# Patient Record
Sex: Male | Born: 1955 | Hispanic: No | Marital: Single
Health system: Southern US, Community
[De-identification: ages and names within clinical notes are randomized; demographics above are authoritative.]

---

## 2003-09-11 ENCOUNTER — Other Ambulatory Visit: Payer: Self-pay

## 2003-12-17 ENCOUNTER — Other Ambulatory Visit: Payer: Self-pay

## 2004-08-14 ENCOUNTER — Emergency Department: Payer: Self-pay | Admitting: Emergency Medicine

## 2004-08-14 ENCOUNTER — Ambulatory Visit: Payer: Self-pay | Admitting: Emergency Medicine

## 2004-08-17 ENCOUNTER — Ambulatory Visit: Payer: Self-pay | Admitting: Surgery

## 2006-03-06 ENCOUNTER — Inpatient Hospital Stay: Payer: Self-pay | Admitting: Internal Medicine

## 2006-03-06 ENCOUNTER — Other Ambulatory Visit: Payer: Self-pay

## 2007-05-14 ENCOUNTER — Inpatient Hospital Stay: Payer: Self-pay | Admitting: Internal Medicine

## 2007-05-14 ENCOUNTER — Other Ambulatory Visit: Payer: Self-pay

## 2008-09-05 ENCOUNTER — Inpatient Hospital Stay: Payer: Self-pay | Admitting: Specialist

## 2008-09-06 ENCOUNTER — Ambulatory Visit: Payer: Self-pay | Admitting: Cardiology

## 2010-02-03 ENCOUNTER — Emergency Department: Payer: Self-pay | Admitting: Unknown Physician Specialty

## 2011-05-06 ENCOUNTER — Observation Stay: Payer: Self-pay | Admitting: Specialist

## 2011-09-26 ENCOUNTER — Inpatient Hospital Stay: Payer: Self-pay | Admitting: Internal Medicine

## 2011-09-26 LAB — URINALYSIS, COMPLETE
Bacteria: NONE SEEN
Ketone: NEGATIVE
Leukocyte Esterase: NEGATIVE
Ph: 6 (ref 4.5–8.0)
Protein: 100
Specific Gravity: 1.026 (ref 1.003–1.030)
Squamous Epithelial: <1
WBC UR: 20 /HPF (ref 0–5)

## 2011-09-26 LAB — BASIC METABOLIC PANEL
Anion Gap: 13 (ref 7–16)
BUN: 26 mg/dL — ABNORMAL HIGH (ref 7–18)
Calcium, Total: 8.6 mg/dL (ref 8.5–10.1)
Chloride: 89 mmol/L — ABNORMAL LOW (ref 98–107)
Creatinine: 1.53 mg/dL — ABNORMAL HIGH (ref 0.60–1.30)
EGFR (African American): >60
EGFR (Non-African Amer.): 50 — ABNORMAL LOW
Glucose: 440 mg/dL — ABNORMAL HIGH (ref 65–99)
Osmolality: 288 (ref 275–301)
Potassium: 4.1 mmol/L (ref 3.5–5.1)
Sodium: 132 mmol/L — ABNORMAL LOW (ref 136–145)

## 2011-09-26 LAB — CBC WITH DIFFERENTIAL/PLATELET
Basophil #: 0.1 10*3/uL (ref 0.0–0.1)
Basophil %: 0.4 %
Eosinophil #: 0 10*3/uL (ref 0.0–0.7)
Eosinophil %: 0.1 %
Lymphocyte #: 1.8 10*3/uL (ref 1.0–3.6)
Lymphocyte %: 10 %
MCH: 31.8 pg (ref 26.0–34.0)
MCV: 95 fL (ref 80–100)
Monocyte #: 1.4 10*3/uL — ABNORMAL HIGH (ref 0.0–0.7)
Neutrophil %: 82 %
Platelet: 166 10*3/uL (ref 150–440)
RBC: 4.83 10*6/uL (ref 4.40–5.90)
RDW: 14 % (ref 11.5–14.5)
WBC: 18.2 10*3/uL — ABNORMAL HIGH (ref 3.8–10.6)

## 2011-09-27 LAB — CBC WITH DIFFERENTIAL/PLATELET
Eosinophil #: 0.2 10*3/uL (ref 0.0–0.7)
Eosinophil %: 1.5 %
Lymphocyte %: 14.1 %
MCH: 32.1 pg (ref 26.0–34.0)
MCV: 95 fL (ref 80–100)
Monocyte #: 1.1 10*3/uL — ABNORMAL HIGH (ref 0.0–0.7)
Monocyte %: 8.6 %
Neutrophil #: 9.5 10*3/uL — ABNORMAL HIGH (ref 1.4–6.5)
Neutrophil %: 75.7 %
Platelet: 210 10*3/uL (ref 150–440)
RBC: 4.56 10*6/uL (ref 4.40–5.90)
RDW: 14.1 % (ref 11.5–14.5)
WBC: 12.6 10*3/uL — ABNORMAL HIGH (ref 3.8–10.6)

## 2011-09-27 LAB — BASIC METABOLIC PANEL
Calcium, Total: 8.2 mg/dL — ABNORMAL LOW (ref 8.5–10.1)
Chloride: 97 mmol/L — ABNORMAL LOW (ref 98–107)
Creatinine: 1.05 mg/dL (ref 0.60–1.30)
EGFR (Non-African Amer.): >60
Glucose: 109 mg/dL — ABNORMAL HIGH (ref 65–99)
Osmolality: 276 (ref 275–301)
Potassium: 3.8 mmol/L (ref 3.5–5.1)
Sodium: 136 mmol/L (ref 136–145)

## 2011-09-27 LAB — URINE CULTURE

## 2011-09-28 LAB — PRO B NATRIURETIC PEPTIDE: B-Type Natriuretic Peptide: 1009 pg/mL — ABNORMAL HIGH (ref 0–125)

## 2011-09-28 LAB — WBC: WBC: 9.7 10*3/uL (ref 3.8–10.6)

## 2011-10-02 LAB — CULTURE, BLOOD (SINGLE)

## 2012-06-06 ENCOUNTER — Emergency Department: Payer: Self-pay | Admitting: Emergency Medicine

## 2012-06-06 LAB — RAPID INFLUENZA A&B ANTIGENS

## 2012-06-09 ENCOUNTER — Emergency Department: Payer: Self-pay | Admitting: Emergency Medicine

## 2012-06-09 LAB — COMPREHENSIVE METABOLIC PANEL
Albumin: 1.9 g/dL — ABNORMAL LOW (ref 3.4–5.0)
Alkaline Phosphatase: 337 U/L — ABNORMAL HIGH (ref 50–136)
Anion Gap: 14 (ref 7–16)
BUN: 65 mg/dL — ABNORMAL HIGH (ref 7–18)
Bilirubin,Total: 0.6 mg/dL (ref 0.2–1.0)
Chloride: 96 mmol/L — ABNORMAL LOW (ref 98–107)
Creatinine: 3.35 mg/dL — ABNORMAL HIGH (ref 0.60–1.30)
EGFR (African American): 23 — ABNORMAL LOW
Osmolality: 293 (ref 275–301)
Potassium: 3.6 mmol/L (ref 3.5–5.1)
SGOT(AST): 28 U/L (ref 15–37)
Sodium: 133 mmol/L — ABNORMAL LOW (ref 136–145)
Total Protein: 6.6 g/dL (ref 6.4–8.2)

## 2012-06-09 LAB — CBC WITH DIFFERENTIAL/PLATELET
Basophil %: 0.4 %
Eosinophil #: 0 10*3/uL (ref 0.0–0.7)
HGB: 17.6 g/dL (ref 13.0–18.0)
Lymphocyte #: 0.3 10*3/uL — ABNORMAL LOW (ref 1.0–3.6)
Lymphocyte %: 1 %
MCHC: 32.8 g/dL (ref 32.0–36.0)
Monocyte %: 2.3 %
Neutrophil #: 30.5 10*3/uL — ABNORMAL HIGH (ref 1.4–6.5)
RDW: 15 % — ABNORMAL HIGH (ref 11.5–14.5)
WBC: 31.7 10*3/uL — ABNORMAL HIGH (ref 3.8–10.6)

## 2012-06-09 LAB — CK TOTAL AND CKMB (NOT AT ARMC)
CK, Total: 234 U/L — ABNORMAL HIGH (ref 35–232)
CK-MB: 9.8 ng/mL — ABNORMAL HIGH (ref 0.5–3.6)

## 2012-11-16 DEATH — deceased

## 2014-08-22 IMAGING — CT CT ABD-PELV W/O CM
1 of 3 series · 14 of 32 positions shown, 18 images · non-contrast
Comparison: none

REASON FOR EXAM: (1) pain swelling, hi wbc count poss forniers gangrene
creat hi; (2) pain, swelling
COMMENTS:

PROCEDURE:     CT  - CT ABDOMEN AND PELVIS W[DATE] [DATE]
RESULT:     This study was compared to a previous study dated 09/26/2011.
TECHNIQUE: Helical noncontrasted 3 mm sections were obtained from the lung
bases through the pubic symphysis.

[Series 3: 3mm soft tissue · axial · 0.79mm/px · z∈[-1262,-802]mm · 14 of 171 slices shown, 18 images]
[im 9/171  soft-tissue]
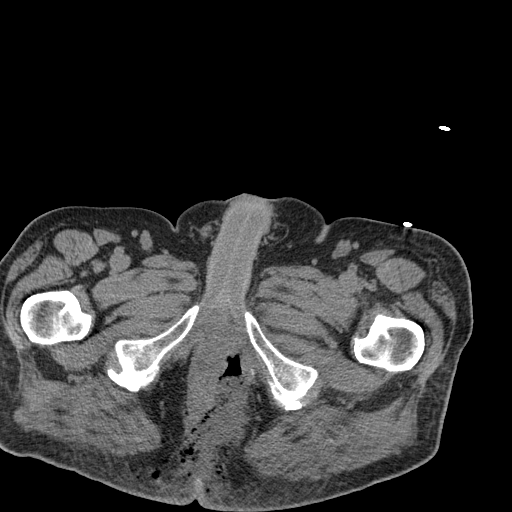
[im 9/171  bone]
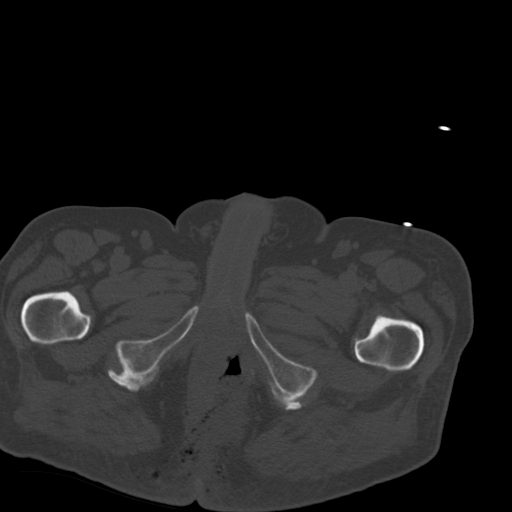
[im 25/171  soft-tissue]
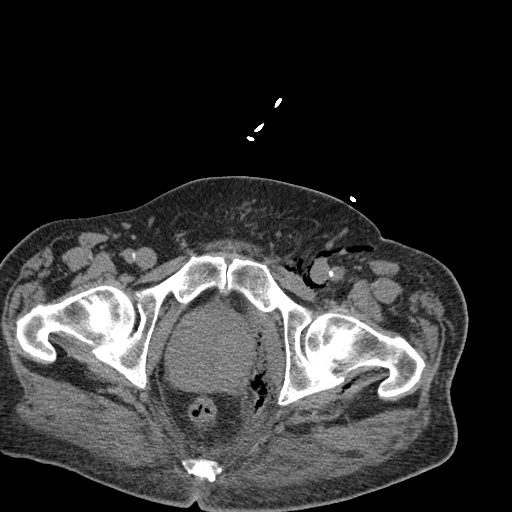
[im 41/171  soft-tissue]
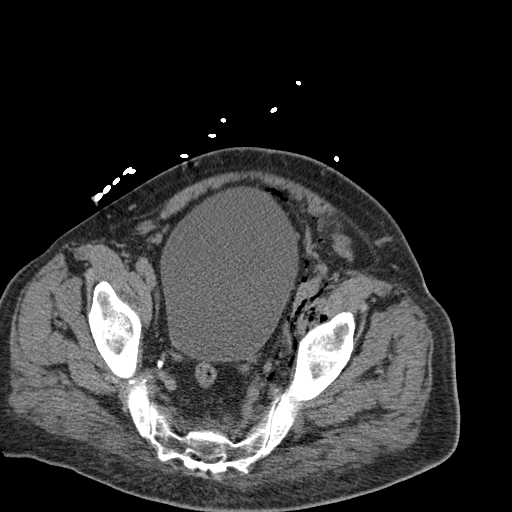
[im 49/171  soft-tissue]
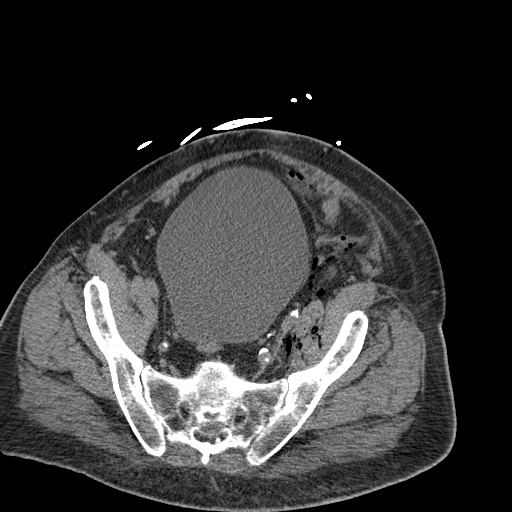
[im 65/171  soft-tissue]
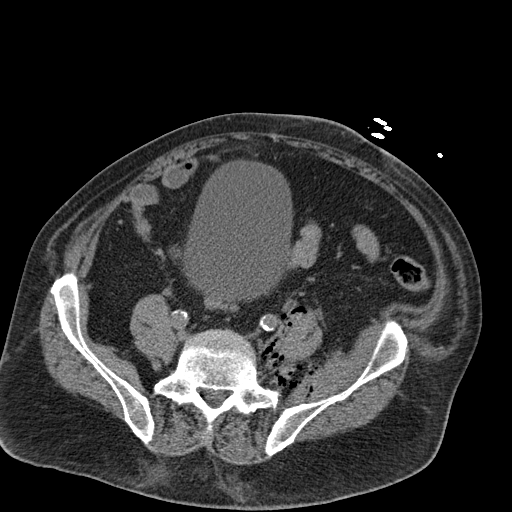
[im 81/171  soft-tissue]
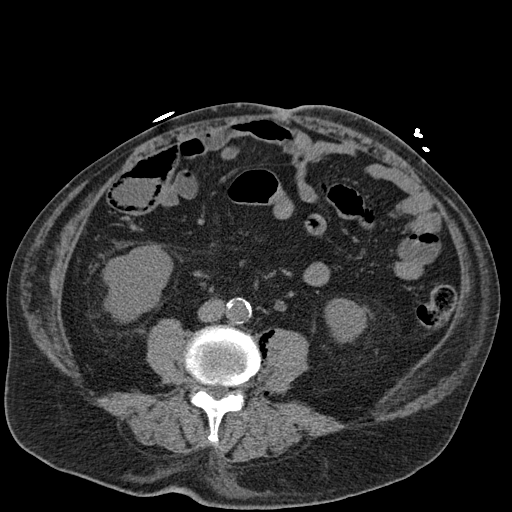
[im 90/171  soft-tissue]
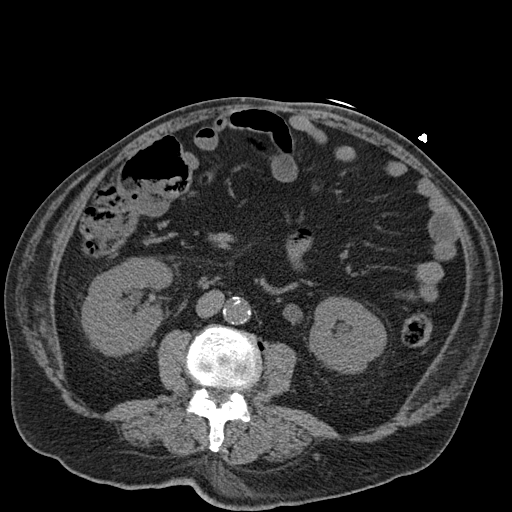
[im 106/171  soft-tissue]
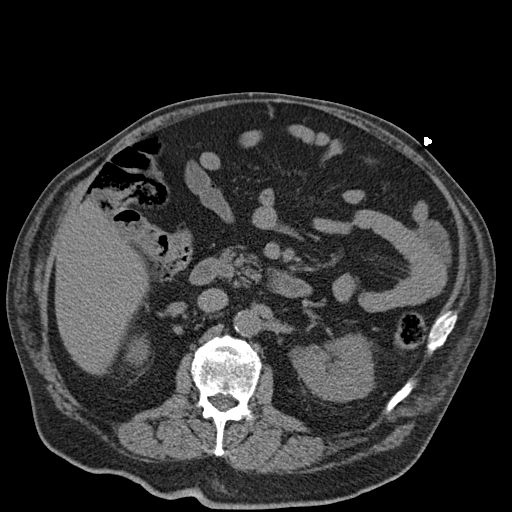
[im 122/171  soft-tissue]
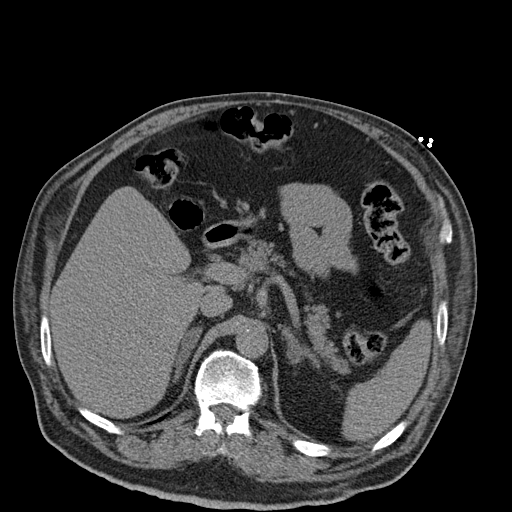
[im 122/171  bone]
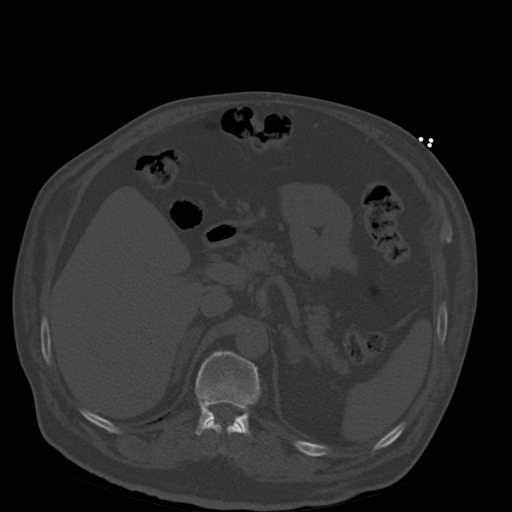
[im 130/171  soft-tissue]
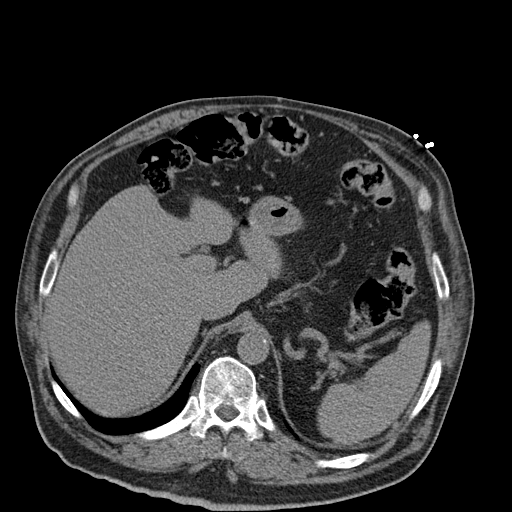
[im 138/171  lung]
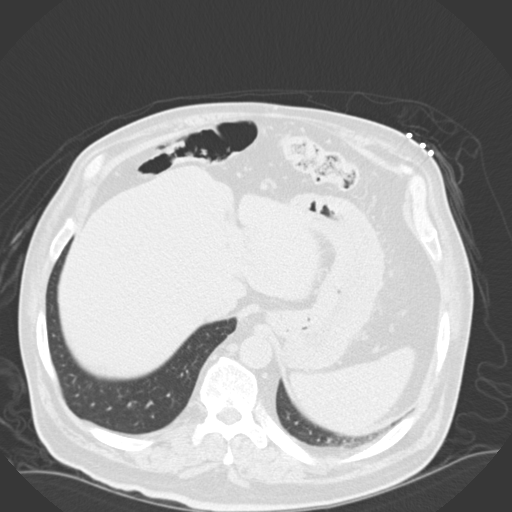
[im 146/171  soft-tissue]
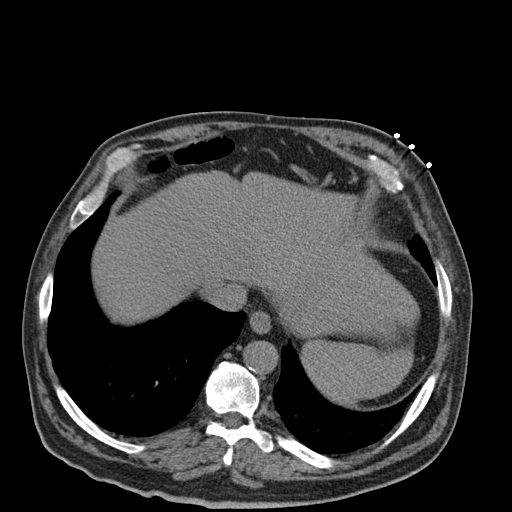
[im 146/171  lung]
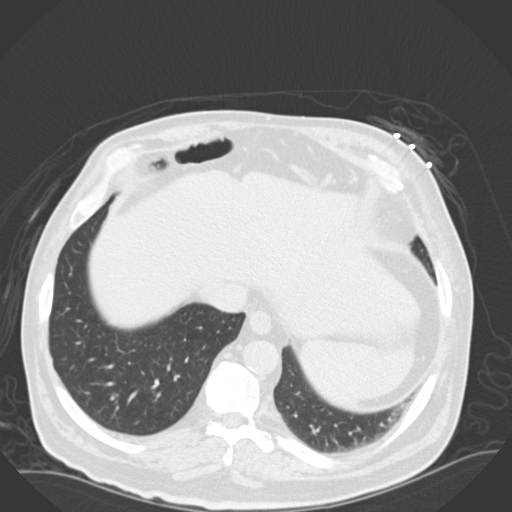
[im 154/171  lung]
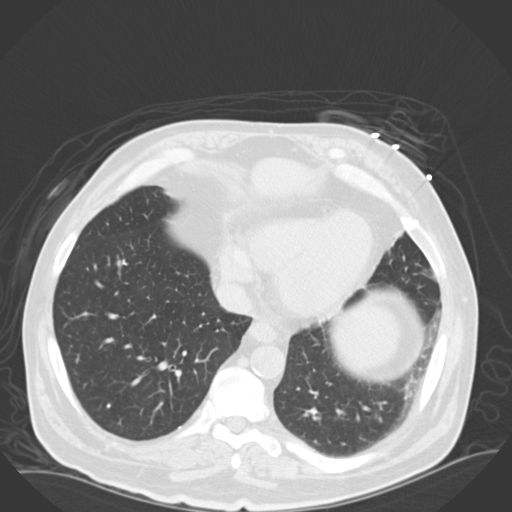
[im 162/171  soft-tissue]
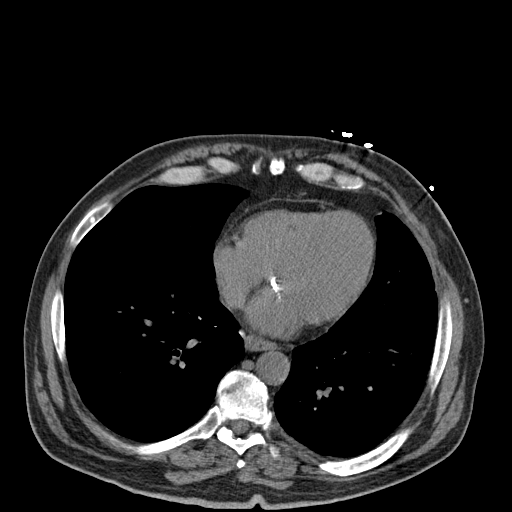
[im 162/171  lung]
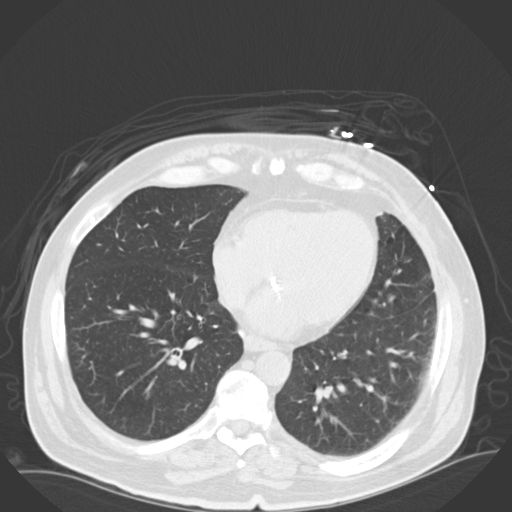

[14 of 32 positions shown; findings below may reference images not displayed]

FINDINGS: The lung bases demonstrate stable pulmonary nodules within the
left lung base as well as hypoventilation versus possibly mild infiltrate.

Noncontrast evaluation of the liver, spleen, adrenals, and pancreas is
unremarkable. Evaluation of the kidneys demonstrates mild bilateral
pelviectasis. The kidneys otherwise are unremarkable. There is no evidence
of bowel obstruction.

There is no evidence of an abdominal aortic aneurysm.

Evaluation of the pelvis demonstrates gas tracking along the deep aspect of
the psoas musculature on the left adjacent to the vertebral body. There is
evidence of gas tracking within the periphery of the psoas musculature as
well as in the iliopsoas region. Foci of gas are identified anterior to the
bladder in an intraperitoneal location. The gas extends to the deep
subcutaneous fat overlying the pubic symphysis as well as within the
inguinal region surrounding the neurovascular bundle. Gas is also
appreciated tracking along the gluteus minimus and medius. In the perirectal
region on the left an amorphous area of intermediate to low attenuation is
identified containing gas with surrounding inflammatory change in the fat. A
second area is identified in a subcutaneous location along the right
perianal region also containing gas. There is diffuse stranding within the
mesenteric fat. There does not appear to be a definable drainable loculated
fluid collection. These areas have more of a phlegmonous appearance. The
osseous structures appear grossly unremarkable. The urinary bladder is
distended with urine.
IMPRESSION: 1.Findings concerning for gas producing necrotizing infection involving the
primarily left portion of the pelvis as well as extension into the inguinal
region on the left and prepubis region. There is extension posteriorly into
the perirectal region on the left and perianal region on the right.
Immediate emergent surgical consultation is recommended.
2. Mild obstructive uropathy is identified.
3. Dr. Milislav of the Emergency Department was informed of these findings
via a telephone call at the time of the initial preliminary evaluation.

## 2014-08-22 IMAGING — CR DG CHEST 1V PORT
1 series · 2 of 2 positions shown · non-contrast
Comparison: none

REASON FOR EXAM: crackles in bases bilat
COMMENTS:

PROCEDURE:     DXR - DXR PORTABLE CHEST SINGLE VIEW  - June 09, 2012  [DATE]
RESULT:

[Series 1: ap · 0.17mm/px · 2 of 2 slices shown]
[im 1/2]
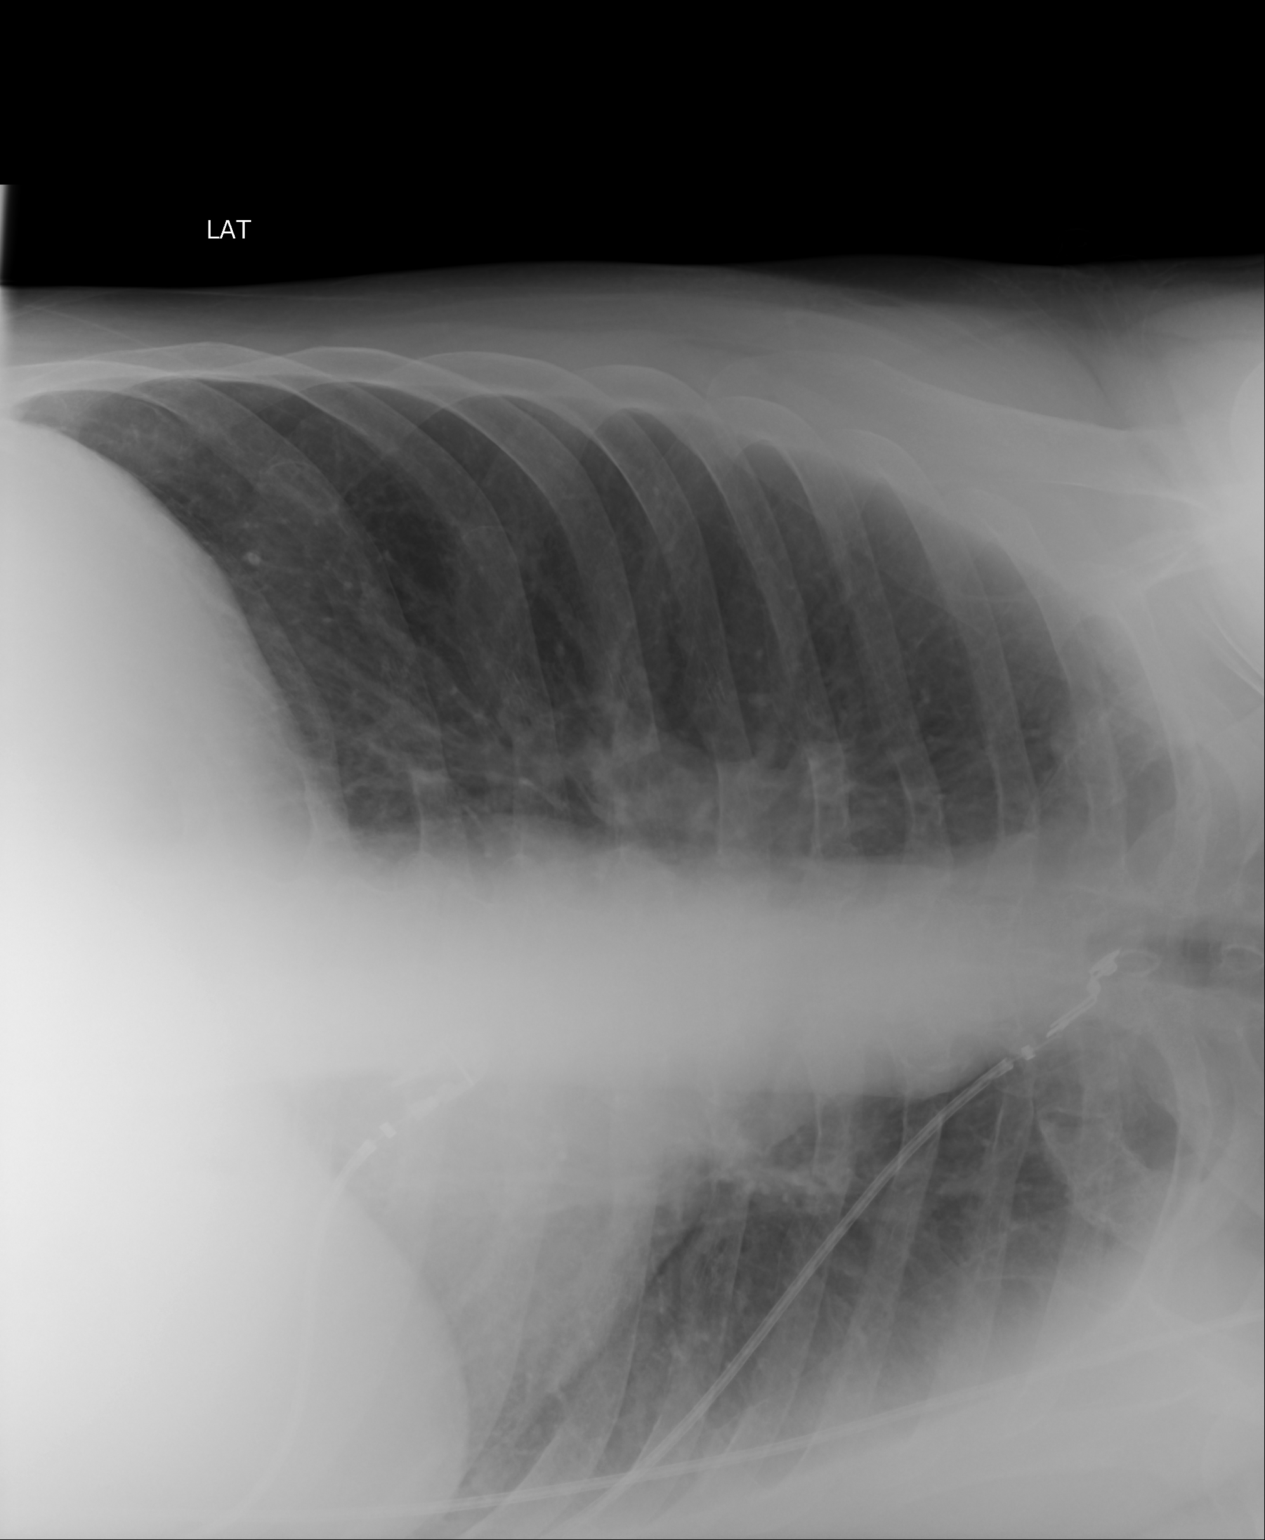
[im 2/2]
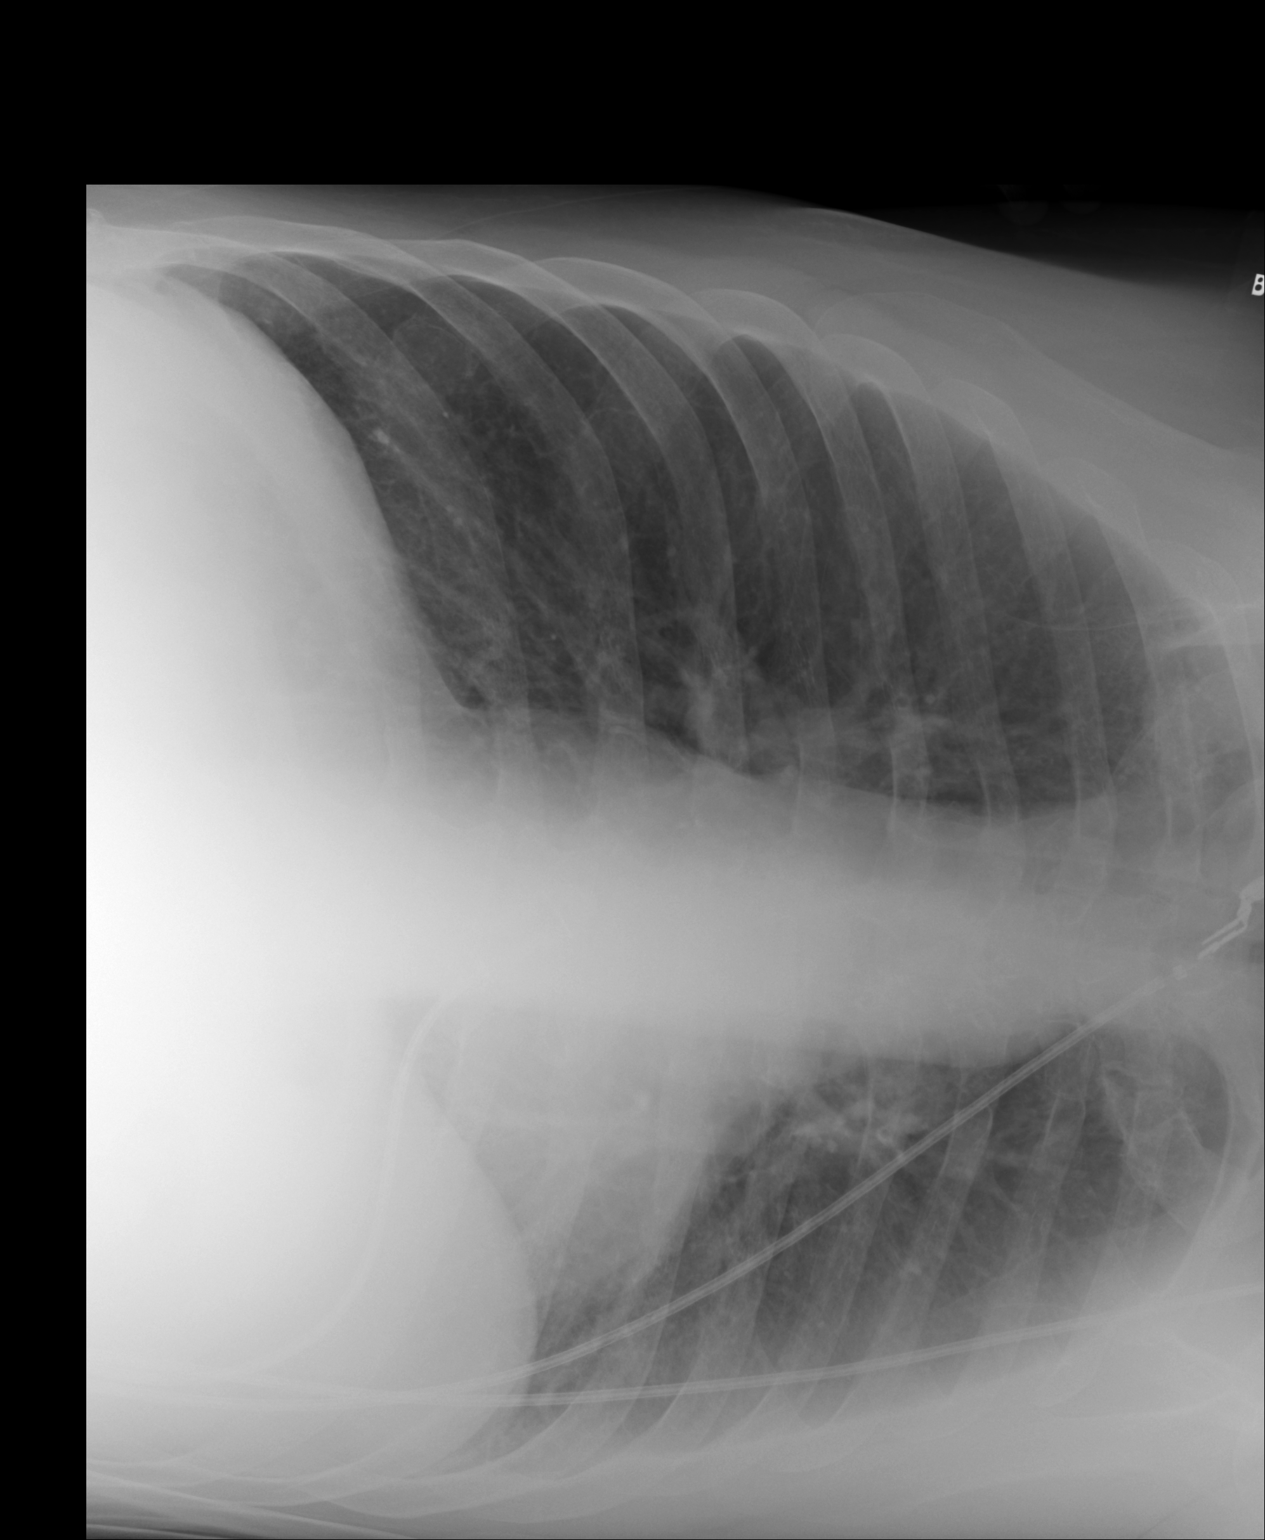

[2 of 2 positions shown; findings below may reference images not displayed]

FINDINGS: There is no evidence of a layering effusion in the left lateral
decubitus position.
IMPRESSION: No evidence of layering effusion.

## 2014-11-10 NOTE — Discharge Summary (Signed)
PATIENT NAME:  Derek Watkins, Derek Watkins MR#:  [id number] DATE OF BIRTH:  May 31, 1956  DATE OF ADMISSION:  09/26/2011 DATE OF DISCHARGE:  09/30/2011  PRIMARY CARE PHYSICIAN: [facility].  DISCHARGE DIAGNOSES:  1. Acute bronchitis now improved on Levaquin.  2. Left lower quadrant abdominal pain, likely due to constipation, now resolved..  3. Dysuria. No obvious urinary tract infection. Urine culture negative, 4. Acute renal failure resolved on hydration, likely prerenal.  5. Hyponatremia and dehydration now resolved with IV fluids.  6. Diabetes, on glipizide and start her on baby aspirin for secondary prevention considering long-standing history of diabetes, hypertension, hyperlipidemia. 7. Hyperlipidemia, started on Pravachol.  8. Hypertension, on metoprolol and lisinopril  started on this admission.   SECONDARY DIAGNOSES:  1. Hypertension.  2. Diabetes with neuropathy. 3. Chronic obstructive pulmonary disease.  4. Degenerative joint disease.  5. Obesity.  6. Hyperlipidemia.  7. History of diastolic dysfunction with  ejection fraction of 55%.  8. Depression.   CONSULTATIONS: None.   LABORATORY, DIAGNOSTIC AND RADIOLOGICAL DATA: Chest x-ray on 10th of March showed interstitial infiltrate suggestive of bronchitis. KUB on 9th of March showed nonobstructive bowel gas pattern. CT scan of the abdomen and pelvis without contrast on 10th of March showed possible cystitis. Enlarged prostate. KUB on consult 12th of March showed nonobstructive bowel gas pattern. Stool throughout colon.  Chest x-ray on 03/12 showed clear lung fields. KUB on 13th of March showed no bowel obstruction. Large amount of fecal material in the colon with little or no appreciable interval change.   MAJOR LABORATORY PANEL:  Urinalysis  on admission showed 47 rbc's 20 wbc's, no bacteria. Blood cultures x2 were negative. Urine culture grew  40,000 colonies of aerobic gram-positive rod and 1000 colonies of gram-negative rods.    HISTORY AND SHORT HOSPITAL COURSE: The patient is a 59 year old male with above-mentioned medical problems and was admitted for possible urinary tract infection, with low-grade fever, tachycardia, leukocytosis, was started on Rocephin empirically although patient's urine culture and blood culture was negative. Chest x-ray was showing possible bronchitis, antibiotic was continued.  The patient's urinary tract infection was ruled out and will start on Levaquin for possible acute bronchitis. He also had acute renal failure and hyponatremia along with dehydration, which was slowly improving with hydration and was almost resolved He had a real trouble having a bowel movement and his Percocet and Tussionex was started on aggressive bowel regimen, daily KUBs were ordered. On 09/30/2011 he did have a bowel movement and was feeling much better and was agreeable to discharge planning. He was started on baby aspirin considering his longstanding history of hypertension, diabetes and hyperlipidemia. His dysuria  symptoms also resolved and his coughing was improved. He was started on some new medications to get his blood pressure under better control along with diabetes and cholesterol which he was in agreement .  PHYSICAL EXAMINATION:  VITAL SIGNS: On the date of discharge vital signs were as follows: Temperature 98.3, heart rate 90 per minute, respirations 20 per minute, blood pressure 144/87 mmHg. He was saturating 93% on room air.   Pertinent Physical Examination:   CARDIOVASCULAR: S1, S2 normal. No murmurs, rubs, or gallop.   LUNGS: Clear to auscultation bilaterally. No wheezing, rales, rhonchi, or crepitation.   ABDOMEN: Soft, benign.   NEUROLOGIC: Nonfocal examination.   All other physical examination remained at baseline.   DISCHARGE MEDICATIONS:  1. Glipizide 10 mg p.o. b.i.d.  2. Metoprolol 25 mg p.o. b.i.d.  3. Lisinopril 5 mg  p.o. daily.  4. Pravachol 40 mg p.o. at bedtime. 5. Aspirin 81 mg  p.o. daily.  6. Colace 200 mg p.o. b.i.d.  7. Senna 8.6 mg p.o. daily.   DISCHARGE DIET: Low sodium, 1800 ADA, low cholesterol.   DISCHARGE ACTIVITY: As tolerated.  DISCHARGE INSTRUCTIONS AND FOLLOW-UP:  The patient was instructed to follow up with his primary care physician at [facility] in 1-2 weeks.   Total time discharging this patient was  45 minutes.   ____________________________ Ellamae Sia. Sherryll Burger, MD vss:ljs D: 10/04/2011 10:57:58 ET T: 10/05/2011 11:11:34 ET JOB#: [id number]  cc: Tacori Kvamme S. Sherryll Burger, MD, <Dictator> [facility] Ellamae Sia [facility] MD ELECTRONICALLY SIGNED 10/05/2011 20:00

## 2014-11-10 NOTE — H&P (Signed)
PATIENT NAME:  Derek Watkins, [name] MR#:  [id number] DATE OF BIRTH:  07/23/1955  DATE OF ADMISSION:  09/26/2011  REFERRING PHYSICIAN: ER physician Dr. Jens SomWiegand PRIMARY CARE PHYSICIAN: Patient is supposed to follow at [facility] but does not do so regularly.  CHIEF COMPLAINT: Abdominal pain, constipation, dysuria.   HISTORY OF PRESENT ILLNESS: Patient is a 59 year old male with past medical history of hypertension, diabetes, chronic obstructive pulmonary disease, hyperlipidemia, depression, ongoing smoking who is noncompliant with medications and outpatient follow up. Patient was last admitted to [facility] in October 2012 for syncope due to malignant hypertension. He was set up to follow up with [facility], however, does not do so regularly. He has not taken any medications for the past 1 to 2 months. He came to the Emergency Room complaining of generalized abdominal pain, constipation and dysuria. He was found to have low-grade fever with tachycardia, uncontrolled diabetes and acute renal failure. Patient reports that every time he tried to use the bathroom to urinate it would hurt in the suprapubic area and burn. He has some cough without expectoration. Denies any fever at home. Denies any shortness of breath, nausea, vomiting.   PAST MEDICAL HISTORY:  1. Hypertension. 2. Diabetes with neuropathy. Last hemoglobin A1c checked in October 2012 11.2.  3. Chronic obstructive pulmonary disease. 4. Degenerative joint disease. 5. Obesity. 6. Hyperlipidemia. Last lipid profile checked 04/2011 LDL 127, cholesterol 281, VLDL 57. 7. History of diastolic dysfunction, ejection fraction of 55% as per echo 2007.  8. History of depression. 9. Ongoing smoking.   PAST SURGICAL HISTORY:  1. Cholecystectomy.  2. Right great toe amputation.   ALLERGIES: Zantac and latex.   MEDICATIONS: Patient has not been taking any medications for the last 1 to 2 months.   SOCIAL HISTORY: Continues to smoke 1 pack per  day. Denies any alcohol or drug abuse. He lives with his sister and stepfather.   FAMILY HISTORY: Mother died at 4362 of cerebrovascular accident. She was also diabetic. Father died at 4062 of some kind of cancer, patient is not aware of which kind it was.    REVIEW OF SYSTEMS: CONSTITUTIONAL: Patient reports fatigue and weakness. Denies any fever. EYES: Denies any blurred or double vision. ENT: Denies any tinnitus, ear pain. RESPIRATORY: Reports some cough. Denies any wheezing or dyspnea. CARDIOVASCULAR: Denies any chest pain, palpitations, syncope. GASTROINTESTINAL: Denies any nausea, vomiting, or diarrhea. Reports abdominal pain and constipation. GENITOURINARY: Reports dysuria and frequency. ENDOCRINE: Denies any thyroid problems, increased sweating. HEME/LYMPH: Denies any anemia, easy bruisability. INTEGUMENTARY: Denies any acne, rash. MUSCULOSKELETAL: Denies any swelling, gout. NEUROLOGICAL: Denies any numbness, weakness. PSYCH: Has history of depression. Denies any insomnia.   PHYSICAL EXAMINATION:  VITAL SIGNS: Temperature 99.9, heart rate 115, respiratory rate 22, blood pressure 144/75, pulse oximetry 91% on room air.   GENERAL: Patient is a 59 year old Caucasian male who is disheveled, unkempt, lying comfortably in bed, not in acute distress.   HEAD: Atraumatic, normocephalic.   EYES: There is no pallor, icterus, or cyanosis. Pupils are equal, round, and reactive to light and accommodation. Extraocular movements are intact.   ENT: Dry mucous membranes. No oropharyngeal erythema or thrush.   NECK: Supple. No masses. No JVD. No thyromegaly or lymphadenopathy. No JVD.   CHEST WALL: No tenderness to palpation. Not using accessory muscles of respiration. No intercostal muscle retractions.   LUNGS: Bilaterally clear to auscultation. No wheezing, rales or rhonchi.  CARDIOVASCULAR: S1, S2 regular. No murmur, rubs, or gallops.  ABDOMEN: Soft, mildly distended. There is mild diffuse tenderness  to palpation especially in the suprapubic region. Hyperactive bowel sounds. No organomegaly appreciated.   SKIN: No rashes or lesions.   PERIPHERIES: No pedal edema, 1+ pedal pulses.   PSYCH: Normal mood and affect.   MUSCULOSKELETAL: No cyanosis, clubbing.   NEUROLOGICAL: Awake, alert, oriented x3. Nonfocal neurological exam. Cranial nerves grossly intact.   LABORATORY, DIAGNOSTIC, AND RADIOLOGICAL DATA: Glucose 440, BUN 26, creatinine 1.52, sodium 132, potassium 4.1, chloride 89, CO2 30, white count 18.2, hemoglobin 15.4, hematocrit 45.9, platelets 166. Urinalysis shows RBC 47, WBC 20, 2+ blood, glucosuria with more than 500 glucose.   ASSESSMENT AND PLAN: 59 year old male with history of diabetes, hypertension, chronic obstructive pulmonary disease, noncompliant with medications has not taken any medications for the past 1 to 2 months presents with abdominal pain, constipation, dysuria.  1. Possible underlying infection. Patient reports possible urinary tract infection. Patient reports dysuria with suprapubic pain. He has a low-grade fever of 99.9 in the ED with tachycardia, heart rate 115. He has got leukocytosis with white count of 18.2. Will obtain blood cultures, urine cultures, chest x-ray PA and lateral and start on empiric Rocephin after all cultures have been drawn.  2. Uncontrolled diabetes. Patient's last hemoglobin A1c was checked 04/2011 and was 11.2. Will start patient on glipizide, insulin sliding scale and ADA diet. Will not restart him on metformin because of acute renal failure.  3. Acute renal failure. This is possibly due to uncontrolled diabetes, glucosuria and dehydration. Will treat with IV fluid hydration, monitor strict ins and outs and ovoid nephrotoxic medications.  4. Mild hyponatremia. This is likely pseudohyponatremia related to uncontrolled diabetes and hyperglycemia. Will hydrate with IV fluids and monitor.  5. Smoking. Patient continues to smoke 1 pack per day.  Counseled patient about cessation for more than three minutes. Will provide a nicotine patch while in the hospital.  6. Abdominal pain. Patient has generalized abdominal pain and reports constipation for the past two days. Will obtain a CAT scan of the abdomen with oral contrast without IV contrast in view of the acute renal failure. 7. Hypertension. Patient's blood pressure is elevated. Will start him on Lopressor and lisinopril. 8. Chronic obstructive pulmonary disease. Will place on p.r.n. DuoNebs. Currently patient has no evidence of wheezing.  9. Hyperlipidemia. Patient's last fasting lipid profile was checked in October 2012 which showed elevated LDL 127, elevated VLDL 57 and elevated cholesterol 281. Will restart him on Pravachol.  10. Will place on GI and deep vein thrombosis prophylaxis.   Reviewed old medical records, discussed with ED physician, discussed with the patient the plan of care and management.   TIME SPENT: 75 minutes.   ____________________________ Darrick Meigs, MD sp:cms D: 09/26/2011 08:11:04 ET T: 09/26/2011 08:52:05 ET JOB#: [id number]  cc: Darrick Meigs, MD, <Dictator> [facility] Darrick Meigs MD ELECTRONICALLY SIGNED 09/27/2011 15:21
